# Patient Record
Sex: Male | Born: 1995 | Race: White | Hispanic: No | Marital: Single | Smoking: Never smoker
Health system: Southern US, Community
[De-identification: ages and names within clinical notes are randomized; demographics above are authoritative.]

## PROBLEM LIST (undated history)

## (undated) DIAGNOSIS — G43909 Migraine, unspecified, not intractable, without status migrainosus: Secondary | ICD-10-CM

## (undated) DIAGNOSIS — J309 Allergic rhinitis, unspecified: Secondary | ICD-10-CM

## (undated) DIAGNOSIS — R7303 Prediabetes: Secondary | ICD-10-CM

## (undated) HISTORY — PX: TYMPANOSTOMY TUBE PLACEMENT: SHX32

## (undated) HISTORY — PX: WISDOM TOOTH EXTRACTION: SHX21

## (undated) HISTORY — PX: ADENOIDECTOMY: SUR15

---
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
## 2014-02-25 ENCOUNTER — Other Ambulatory Visit: Payer: Self-pay

## 2014-02-25 MED ORDER — SUMATRIPTAN SUCCINATE 25 MG PO TABS: ORAL_TABLET | ORAL | Status: AC

## 2014-07-27 IMAGING — CR DG ANKLE COMPLETE 3+V*R*
3 series · 3 of 3 positions shown · non-contrast
Comparison: None

CLINICAL DATA: Ankle pain.

RIGHT ANKLE - COMPLETE 3+ VIEW

[x ankle ap right]
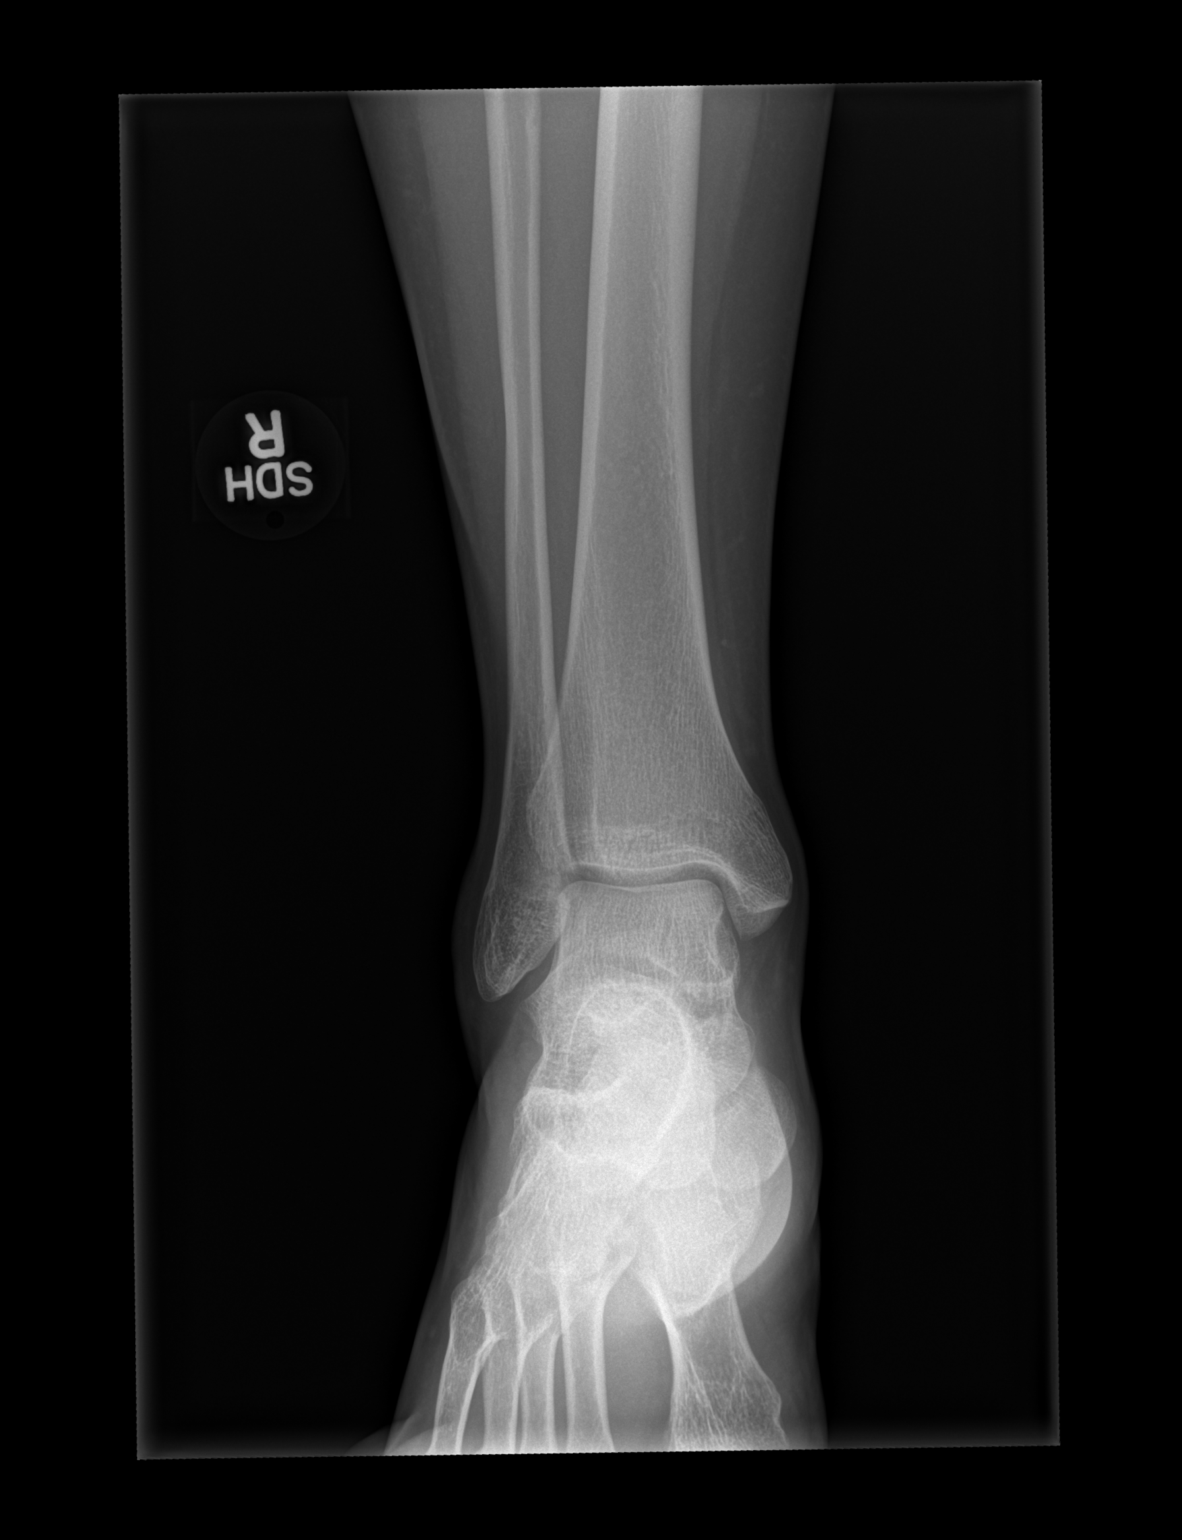

[x ankle obl right]
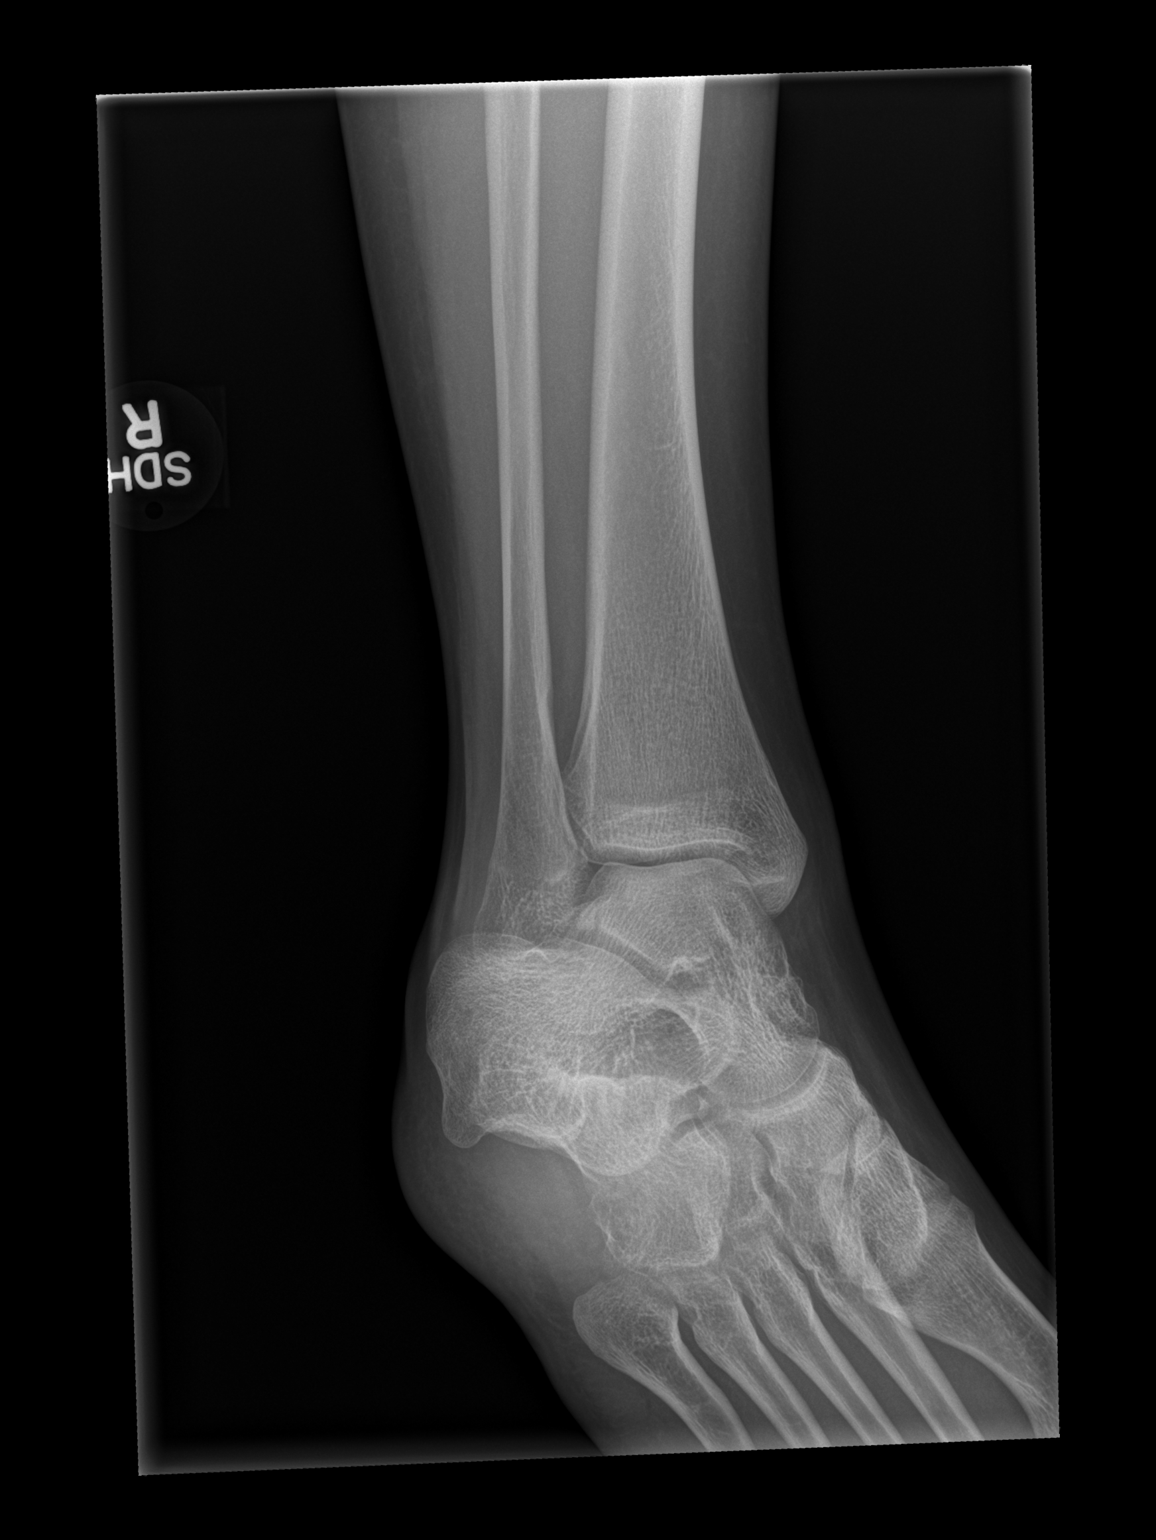

[x ankle lat right]
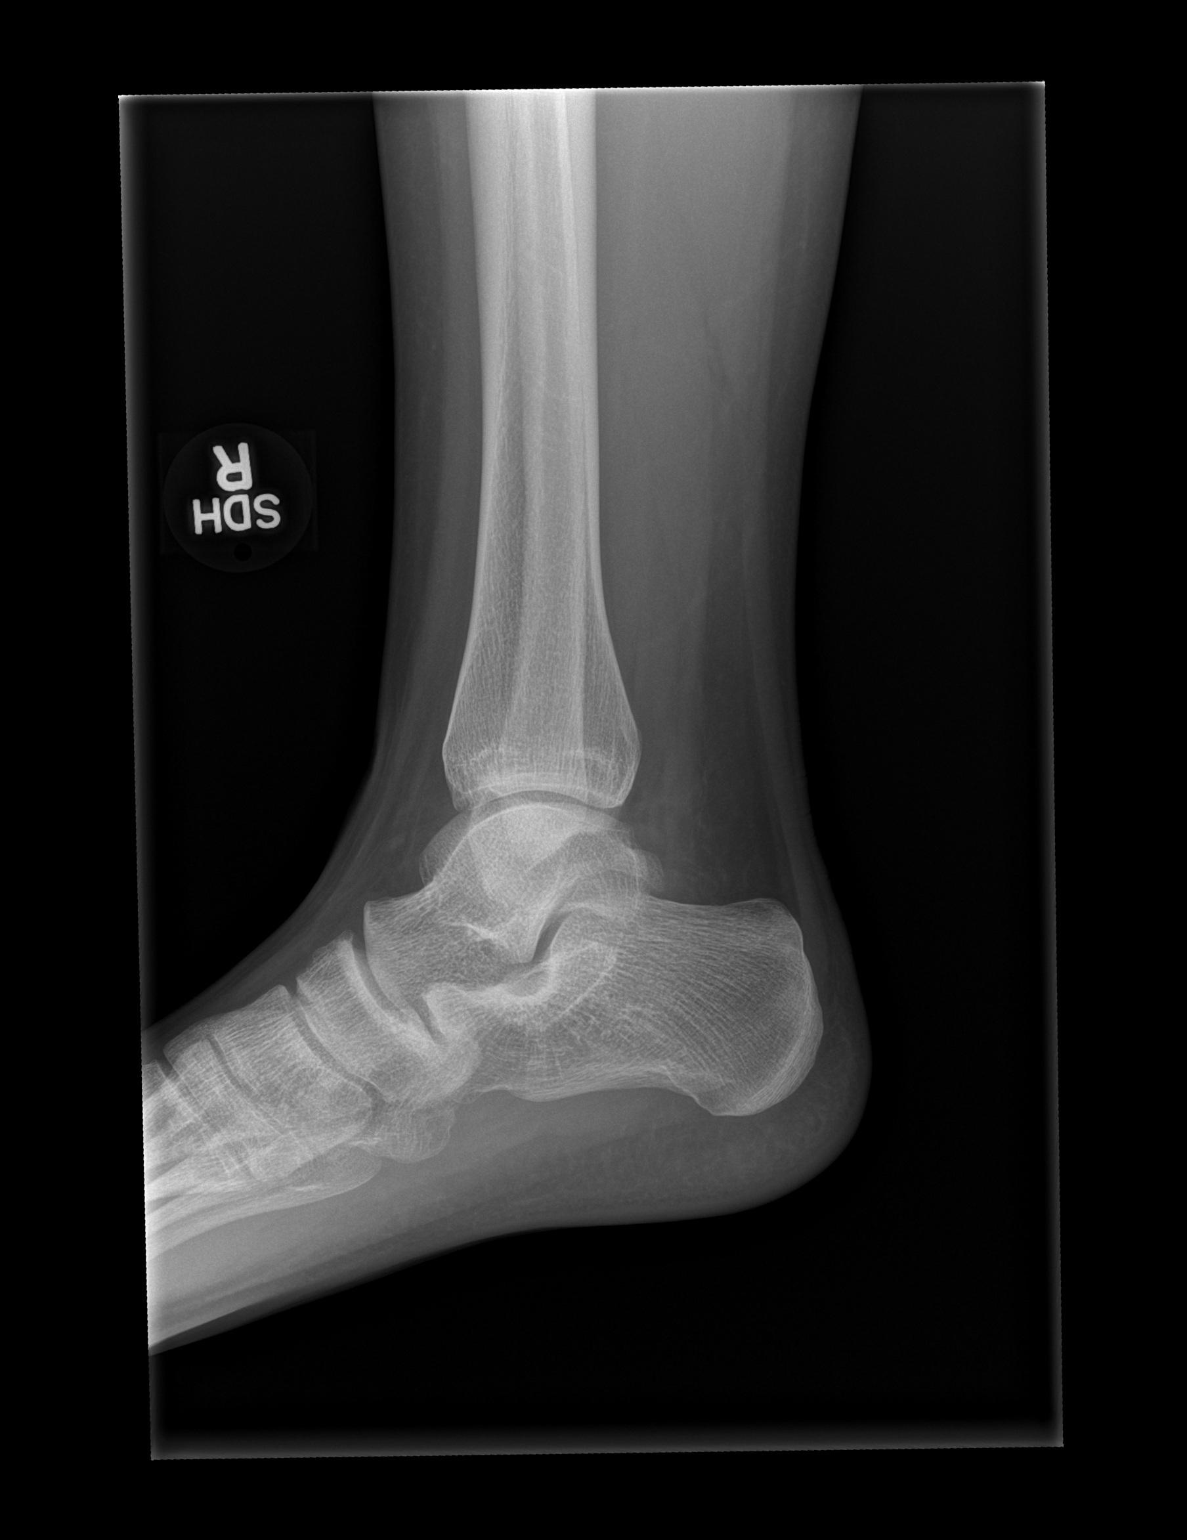

[3 of 3 positions shown; findings below may reference images not displayed]

FINDINGS: No acute bony abnormality.  Specifically, no fracture,
subluxation, or dislocation.  Soft tissues are intact. Joint spaces
are maintained.  Normal bone mineralization.
IMPRESSION: Normal study.

## 2014-08-04 ENCOUNTER — Other Ambulatory Visit: Payer: Self-pay | Admitting: Pediatrics

## 2015-08-10 ENCOUNTER — Encounter: Payer: Self-pay | Admitting: Pediatrics

## 2015-08-11 ENCOUNTER — Encounter: Payer: Self-pay | Admitting: Pediatrics
